# Patient Record
Sex: Female | Born: 1980 | Race: White | Hispanic: No | Marital: Married | Smoking: Never smoker
Health system: Southern US, Community
[De-identification: ages and names within clinical notes are randomized; demographics above are authoritative.]

## PROBLEM LIST (undated history)

## (undated) DIAGNOSIS — N289 Disorder of kidney and ureter, unspecified: Secondary | ICD-10-CM

## (undated) DIAGNOSIS — J45909 Unspecified asthma, uncomplicated: Secondary | ICD-10-CM

---

## 2015-02-06 ENCOUNTER — Other Ambulatory Visit: Payer: Self-pay

## 2015-02-06 ENCOUNTER — Other Ambulatory Visit: Payer: Self-pay | Admitting: General Surgery

## 2015-02-06 DIAGNOSIS — D1720 Benign lipomatous neoplasm of skin and subcutaneous tissue of unspecified limb: Secondary | ICD-10-CM

## 2015-02-06 DIAGNOSIS — D179 Benign lipomatous neoplasm, unspecified: Secondary | ICD-10-CM

## 2015-02-07 ENCOUNTER — Ambulatory Visit
Admission: RE | Admit: 2015-02-07 | Discharge: 2015-02-07 | Disposition: A | Discharge status: Home / Self Care | Payer: 59 | Source: Ambulatory Visit | Attending: General Surgery | Admitting: General Surgery

## 2015-02-07 DIAGNOSIS — D1720 Benign lipomatous neoplasm of skin and subcutaneous tissue of unspecified limb: Secondary | ICD-10-CM

## 2015-07-11 ENCOUNTER — Other Ambulatory Visit: Payer: Self-pay | Admitting: Physician Assistant

## 2015-07-12 LAB — CYTOLOGY - PAP

## 2015-07-21 ENCOUNTER — Other Ambulatory Visit (HOSPITAL_COMMUNITY)
Admission: RE | Admit: 2015-07-21 | Discharge: 2015-07-21 | Disposition: A | Discharge status: Home / Self Care | Payer: 59 | Source: Ambulatory Visit | Attending: Family Medicine | Admitting: Family Medicine

## 2015-07-21 DIAGNOSIS — Z124 Encounter for screening for malignant neoplasm of cervix: Secondary | ICD-10-CM | POA: Insufficient documentation

## 2015-08-18 ENCOUNTER — Emergency Department (HOSPITAL_COMMUNITY)
Admission: EM | Admit: 2015-08-18 | Discharge: 2015-08-18 | Disposition: A | Discharge status: Home / Self Care | Payer: Self-pay | Attending: Emergency Medicine | Admitting: Emergency Medicine

## 2015-08-18 ENCOUNTER — Emergency Department (HOSPITAL_COMMUNITY): Payer: Self-pay

## 2015-08-18 ENCOUNTER — Encounter (HOSPITAL_COMMUNITY): Payer: Self-pay | Admitting: Cardiology

## 2015-08-18 DIAGNOSIS — R42 Dizziness and giddiness: Secondary | ICD-10-CM | POA: Insufficient documentation

## 2015-08-18 DIAGNOSIS — J45909 Unspecified asthma, uncomplicated: Secondary | ICD-10-CM | POA: Insufficient documentation

## 2015-08-18 DIAGNOSIS — N183 Chronic kidney disease, stage 3 (moderate): Secondary | ICD-10-CM | POA: Insufficient documentation

## 2015-08-18 DIAGNOSIS — Z79899 Other long term (current) drug therapy: Secondary | ICD-10-CM | POA: Insufficient documentation

## 2015-08-18 DIAGNOSIS — Z880 Allergy status to penicillin: Secondary | ICD-10-CM | POA: Insufficient documentation

## 2015-08-18 DIAGNOSIS — I129 Hypertensive chronic kidney disease with stage 1 through stage 4 chronic kidney disease, or unspecified chronic kidney disease: Secondary | ICD-10-CM | POA: Insufficient documentation

## 2015-08-18 DIAGNOSIS — K224 Dyskinesia of esophagus: Secondary | ICD-10-CM

## 2015-08-18 HISTORY — DX: Disorder of kidney and ureter, unspecified: N28.9

## 2015-08-18 HISTORY — DX: Unspecified asthma, uncomplicated: J45.909

## 2015-08-18 LAB — I-STAT CHEM 8, ED
BUN: 25 mg/dL — AB (ref 6–20)
CHLORIDE: 103 mmol/L (ref 101–111)
Calcium, Ion: 1.15 mmol/L (ref 1.12–1.23)
Creatinine, Ser: 2.3 mg/dL — ABNORMAL HIGH (ref 0.44–1.00)
Glucose, Bld: 96 mg/dL (ref 65–99)
HCT: 45 % (ref 36.0–46.0)
Hemoglobin: 15.3 g/dL — ABNORMAL HIGH (ref 12.0–15.0)
POTASSIUM: 3.6 mmol/L (ref 3.5–5.1)
SODIUM: 139 mmol/L (ref 135–145)
TCO2: 24 mmol/L (ref 0–100)

## 2015-08-18 LAB — I-STAT TROPONIN, ED: Troponin i, poc: 0 ng/mL (ref 0.00–0.08)

## 2015-08-18 LAB — TROPONIN I

## 2015-08-18 NOTE — ED Provider Notes (Signed)
CSN: [id number]     Arrival date & time 08/18/15  1044 History   By signing my name below, I, Emily Brennan, attest that this documentation has been prepared under the direction and in the presence of Emily Schwartz, MD. Electronically Signed: Erling Brennan, ED Scribe. 08/18/2015. 2:54 PM.  Chief Complaint  Patient presents with  . Chest Pain    The history is provided by the patient. No language interpreter was used.    HPI Comments: Emily Brennan is a 34 y.o. female with a h/o stage III kidney disease, HTN, and asthma who presents to the Emergency Department complaining of sudden onset, sharp, moderate, mid sternal, chest pain that began 30 minutes ago. Pt notes it lasted for about 20 minutes and began to radiate down her right arm. She reports associated mild dizziness. Pt denies any h/o heart issues. Pt notes that her grandfather had a heart attack in his 9s but denies any other family members with cardiac issues. She denies any aggravating/alleviating factors. She states she takes a blood pressure medication daily. Pt denies any h/o acid reflux. She is a non smoker. Pt denies eating this morning prior to onset of chest pain. She denies any nausea, vomiting, diaphoresis or other associated symptoms.  Past Medical History  Diagnosis Date  . Renal disorder   . Asthma    Past Surgical History  Procedure Laterality Date  . Cesarean section     History reviewed. No pertinent family history. Social History  Substance Use Topics  . Smoking status: Never Smoker   . Smokeless tobacco: None  . Alcohol Use: No   OB History    No data available     Review of Systems  All other systems reviewed and are negative.     Allergies  Penicillins and Sulfur  Home Medications   Prior to Admission medications   Medication Sig Start Date End Date Taking? Authorizing Provider  budesonide-formoterol (SYMBICORT) 160-4.5 MCG/ACT inhaler Inhale 1 puff into the lungs at bedtime.   Yes  Historical Provider, MD  cholecalciferol (VITAMIN D) 1000 UNITS tablet Take 1,000 Units by mouth daily.   Yes Historical Provider, MD  hydrochlorothiazide (MICROZIDE) 12.5 MG capsule Take 12.5 mg by mouth daily.   Yes Historical Provider, MD  losartan (COZAAR) 50 MG tablet Take 50 mg by mouth.   Yes Historical Provider, MD   Triage Vitals: 3BP 135/84 mmHg  Pulse 92  Temp(Src) 98 F (36.7 C) (Oral)  Resp 16  Ht 5\' 4"  (1.626 m)  Wt 144 lb (65.318 kg)  BMI 24.71 kg/m2  SpO2 100%  LMP 08/03/2015   Physical Exam Physical Exam  Nursing note and vitals reviewed. Constitutional: She is oriented to person, place, and time. She appears well-developed and well-nourished. No distress.  HENT:  Head: Normocephalic and atraumatic.  Eyes: Pupils are equal, round, and reactive to light.  Neck: Normal range of motion.  Cardiovascular: Normal rate and intact distal pulses.   Pulmonary/Chest: No respiratory distress.  Abdominal: Normal appearance. She exhibits no distension.  Musculoskeletal: Normal range of motion.  Neurological: She is alert and oriented to person, place, and time. No cranial nerve deficit.  Skin: Skin is warm and dry. No rash noted.  Psychiatric: She has a normal mood and affect. Her behavior is normal.   ED Course  Procedures (including critical care time)  DIAGNOSTIC STUDIES: Oxygen Saturation is 100% on RA, normal by my interpretation.    COORDINATION OF CARE: 10:56 AM- Will order 12-lead EKG,  troponin levels and other basic diagnostic lab work. Pt advised of plan for treatment and pt agrees.     Labs Review Labs Reviewed  I-STAT CHEM 8, ED - Abnormal; Notable for the following:    BUN 25 (*)    Creatinine, Ser 2.30 (*)    Hemoglobin 15.3 (*)    All other components within normal limits  TROPONIN I  I-STAT TROPOININ, ED    Imaging Review Dg Chest Portable 1 View  08/18/2015  CLINICAL DATA:  Chest pain. EXAM: PORTABLE CHEST 1 VIEW COMPARISON:  None.  FINDINGS: The heart size and mediastinal contours are within normal limits. Both lungs are clear. The visualized skeletal structures are unremarkable. IMPRESSION: No active disease. Electronically Signed   By: Kathreen Devoid   On: 08/18/2015 11:30   I have personally reviewed and evaluated these images and lab results as part of my medical decision-making.   EKG Interpretation   Date/Time:  Friday August 18 2015 10:53:38 EST Ventricular Rate:  88 PR Interval:  124 QRS Duration: 91 QT Interval:  368 QTC Calculation: 445 R Axis:   88 Text Interpretation:  Sinus rhythm Baseline wander in lead(s) V1 V2 V3  Normal ECG Confirmed by Falisha Osment  MD, Libni Fusaro ([id number]) on 08/18/2015 11:15:23  AM     Patient had no return of pain.  In light of the history normal EKG and delta troponins which were negative doubt ACS.  Will treat with H2 blocker MDM   Final diagnoses:  Esophageal spasm  I personally performed the services described in this documentation, which was scribed in my presence. The recorded information has been reviewed and considered.     Emily Schwartz, MD 08/18/15 1455

## 2015-08-18 NOTE — ED Notes (Signed)
MD at bedside. 

## 2015-08-18 NOTE — Discharge Instructions (Signed)
Esophageal Spasm  An esophageal spasm is a muscle spasm of the tube that connects the back of your throat to your stomach (esophagus). Your esophagus normally moves food and liquids down into your stomach with smooth, wavelike muscle contractions. If you have esophageal spasms, abnormal muscle contractions in your esophagus can be painful and cause you to have difficulty swallowing (dysphagia).  There are two types of esophageal spasms. You may have one or both types:   Diffuse esophageal spasms. These are irregular, uncoordinated muscle movements of the esophagus. The diffuse type causes more dysphagia.   Nutcracker esophagus. This is a type of muscle contraction that is coordinated but too strong. The muscles move in a regular order, but the contraction is stronger than necessary. The nutcracker type of esophageal spasm is more painful.  Severe cases of esophageal spasm can make it hard to eat well and do all your usual activities. Esophageal spasms often occur along with severe heartburn (reflux esophagitis). Swallowed foods or liquids may also come back up into your throat (regurgitation).   CAUSES   The cause of esophageal spasm is not known.  RISK FACTORS  You may have a higher risk for esophageal spasm if you:   Are female.   Are an older person.   Eat very quickly.   Swallow foods or drinks that are very hot or very cold.   Are depressed or anxious.  SIGNS AND SYMPTOMS   Signs and symptoms can vary from day to day. They may be mild or severe. They may last for minutes or hours. Common signs and symptoms include:   Chest pain. This may feel like a heart attack.   Back pain.   Dysphagia.   Heartburn.   The feeling that something is stuck in your throat (globus).   Regurgitation of foods or liquids.  DIAGNOSIS   Your health care provider may suspect esophageal spasm based on your symptoms and a physical exam. Tests may be done to help confirm the diagnosis. These may include:   Endoscopy. A  flexible telescope is passed into your esophagus.   Barium swallow. An X-ray is done while you drink a substance (contrast material) that shows up on X-ray.   Esophageal manometry. Pressure measurements of the inside of your esophagus are taken while you swallow.  TREATMENT   Mild cases of esophageal spasms may not need to be treated. You may be able to manage the spasms by avoiding the foods and eating habits that trigger them. Treatment for spasms that are more severe or frequent can include the following:   You may be given medicine to:   Relax the esophageal muscles.   Relieve muscle spasms (calcium channel blockers and nitrates).   Block nerve endings in the esophagus. This is done with an injection of a certain toxin (botulinum).   Relieve heartburn (proton pump inhibitors).   Antidepressant medicines are sometimes used to ease symptoms.   For severe cases, surgery is sometimes done to reduce esophageal muscle contractions (myotomy).  HOME CARE INSTRUCTIONS    Take medicines only as directed by your health care provider.   Do not eat foods that trigger heartburn or esophageal spasms.   Eat your meals slowly.   Chew your food completely.   Avoid foods and drinks that are very hot or very cold.   Find ways to manage stress.   Ask for help if you struggle with depression or anxiety.   Keep all follow-up visits as directed by your health   care provider. This is important.  SEEK MEDICAL CARE IF:    Your symptoms change or get worse.   You are losing weight because of dysphagia.   Your medicine is not helping your symptoms.   Your esophageal spasms are interfering with your quality of life.  SEEK IMMEDIATE MEDICAL CARE IF:    You have very bad or unusual chest pain.   You have trouble breathing.   You choke.  MAKE SURE YOU:   Understand these instructions.   Will watch your condition.   Will get help right away if you are not doing well or get worse.     This information is not intended to  replace advice given to you by your health care provider. Make sure you discuss any questions you have with your health care provider.     Document Released: 11/09/2002 Document Revised: 09/09/2014 Document Reviewed: 11/12/2013  [employer] Interactive Patient Education 2016 [employer]

## 2015-08-18 NOTE — ED Notes (Signed)
Sudden onset of mid sternal cp 20 min ago.  Pain started radiating down right arm and pt had some dizziness.

## 2016-12-14 IMAGING — CR DG CHEST 1V PORT
1 series · 1 of 1 positions shown · non-contrast
Comparison: None.

CLINICAL DATA: Chest pain.

EXAM:
PORTABLE CHEST 1 VIEW

[ap portable]
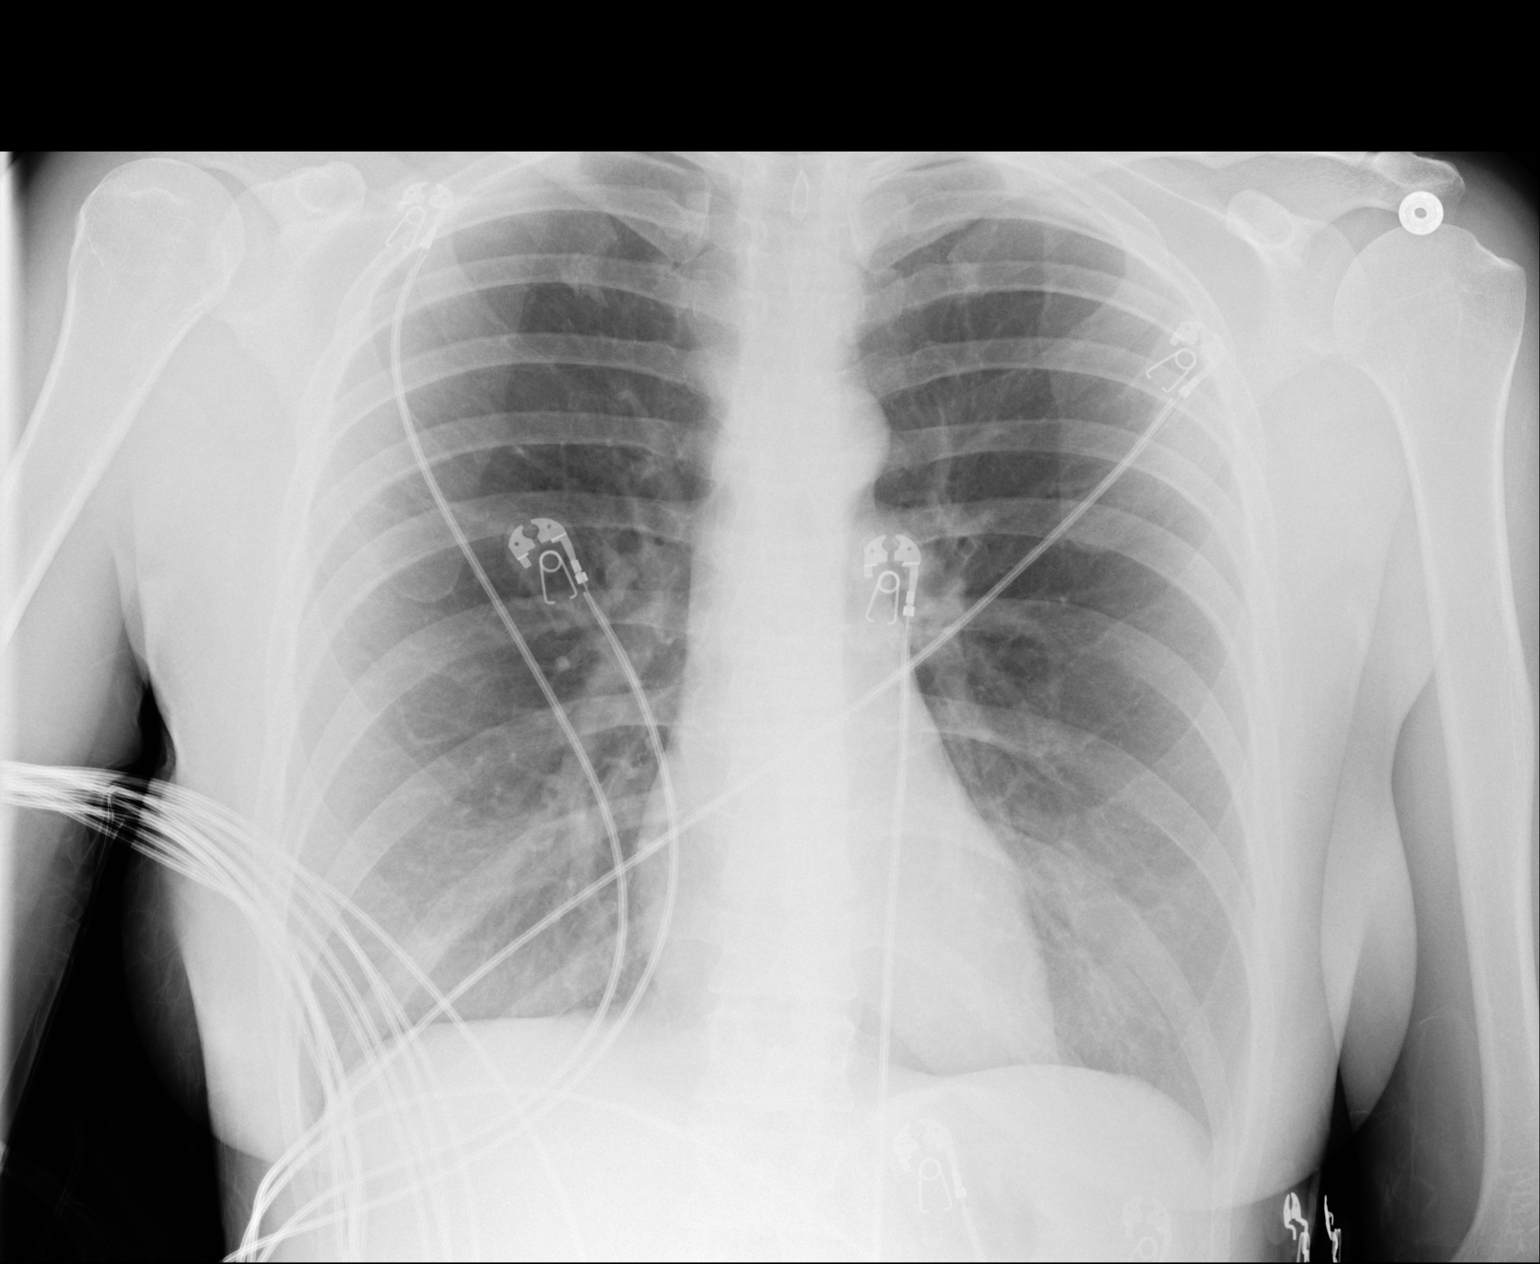

[1 of 1 positions shown; findings below may reference images not displayed]

FINDINGS: The heart size and mediastinal contours are within normal limits.
Both lungs are clear. The visualized skeletal structures are
unremarkable.
IMPRESSION: No active disease.
